# Patient Record
Sex: Male | Born: 1965 | Race: White | Hispanic: No | Marital: Single | Smoking: Current some day smoker
Health system: Southern US, Community
[De-identification: ages and names within clinical notes are randomized; demographics above are authoritative.]

## PROBLEM LIST (undated history)

## (undated) DIAGNOSIS — J449 Chronic obstructive pulmonary disease, unspecified: Secondary | ICD-10-CM

## (undated) DIAGNOSIS — I10 Essential (primary) hypertension: Secondary | ICD-10-CM

## (undated) HISTORY — PX: WRIST SURGERY: SHX841

---

## 2010-11-17 ENCOUNTER — Emergency Department (HOSPITAL_COMMUNITY): Payer: Self-pay

## 2010-11-17 ENCOUNTER — Observation Stay (HOSPITAL_COMMUNITY)
Admission: EM | Admit: 2010-11-17 | Discharge: 2010-11-18 | Disposition: A | Discharge status: Home / Self Care | Payer: Self-pay | Attending: Orthopedic Surgery | Admitting: Orthopedic Surgery

## 2010-11-17 DIAGNOSIS — Y998 Other external cause status: Secondary | ICD-10-CM | POA: Insufficient documentation

## 2010-11-17 DIAGNOSIS — Z01818 Encounter for other preprocedural examination: Secondary | ICD-10-CM | POA: Insufficient documentation

## 2010-11-17 DIAGNOSIS — W108XXA Fall (on) (from) other stairs and steps, initial encounter: Secondary | ICD-10-CM | POA: Insufficient documentation

## 2010-11-17 DIAGNOSIS — Z01812 Encounter for preprocedural laboratory examination: Secondary | ICD-10-CM | POA: Insufficient documentation

## 2010-11-17 DIAGNOSIS — G5600 Carpal tunnel syndrome, unspecified upper limb: Secondary | ICD-10-CM | POA: Insufficient documentation

## 2010-11-17 DIAGNOSIS — Y92009 Unspecified place in unspecified non-institutional (private) residence as the place of occurrence of the external cause: Secondary | ICD-10-CM | POA: Insufficient documentation

## 2010-11-17 DIAGNOSIS — S52599A Other fractures of lower end of unspecified radius, initial encounter for closed fracture: Principal | ICD-10-CM | POA: Insufficient documentation

## 2010-11-17 DIAGNOSIS — S0100XA Unspecified open wound of scalp, initial encounter: Secondary | ICD-10-CM | POA: Insufficient documentation

## 2010-11-17 LAB — CBC
HCT: 38.9 % — ABNORMAL LOW (ref 39.0–52.0)
MCV: 91.5 fL (ref 78.0–100.0)
Platelets: 440 10*3/uL — ABNORMAL HIGH (ref 150–400)
RBC: 4.25 MIL/uL (ref 4.22–5.81)
WBC: 18.5 10*3/uL — ABNORMAL HIGH (ref 4.0–10.5)

## 2010-11-17 LAB — PROTIME-INR: Prothrombin Time: 12.2 seconds (ref 11.6–15.2)

## 2010-11-17 LAB — BASIC METABOLIC PANEL
BUN: 9 mg/dL (ref 6–23)
Chloride: 107 mEq/L (ref 96–112)
GFR calc non Af Amer: >60 mL/min (ref >60)
Potassium: 4.2 mEq/L (ref 3.5–5.1)
Sodium: 135 mEq/L (ref 135–145)

## 2010-11-21 NOTE — Op Note (Signed)
Stephen Wyatt, YEPIZ NO.:  [id number]  MEDICAL RECORD NO.:  [id number]           PATIENT TYPE:  LOCATION:                                 FACILITY:  PHYSICIAN:  Dionne Ano. Lester Crickenberger, M.D.DATE OF BIRTH:  Dec 17, 1965  DATE OF PROCEDURE: DATE OF DISCHARGE:                              OPERATIVE REPORT   PREOPERATIVE DIAGNOSES: 1. Comminuted complex intraarticular distal radius fracture, right     upper extremity. 2. Acute carpal tunnel syndrome of right upper extremity secondary to     swelling and fracture. 3. Complex scalp laceration.  POSTOPERATIVE DIAGNOSES: 1. Comminuted complex intraarticular distal radius fracture, right     upper extremity. 2. Acute carpal tunnel syndrome of right upper extremity secondary to     swelling and fracture. 3. Complex scalp laceration.  PROCEDURES: 1. Open reduction and internal fixation with DVR plate and screw     construct of right distal radius fracture, intraarticular in nature     with greater than 5 fragments. 2. Right open carpal tunnel release. 3. Fasciotomy about the superficial and deep compartments, right upper     extremity. 4. Irrigation and debridement of skin, subcutaneous tissue, and bone,     large complex scalp laceration with incomplete repair in the     emergency room. 5. Complex repair of scalp laceration, greater than 10 cm.  SURGEON:  Dionne Ano. Amanda Pea, MD  ASSISTANT:  None.  ANESTHESIA:  General.  COMPLICATIONS:  None immediate.  TOURNIQUET TIME:  Less than an hour.  One drain was placed in the arm.  INDICATIONS FOR PROCEDURE:  A 45 year old male presents with above- mentioned diagnoses.  I have counseled him in regards to risks and benefits of surgery including risks of infection, bleeding, anesthesia, damage to normal structures, and failure of surgery to accomplish its intended goals of relieving symptoms and restoring function.  With this in mind, he desires to proceed.  All  questions have been encouraged and answered preoperatively.  OPERATIVE PROCEDURE:  The patient was seen by myself and Anesthesia.  A time-out was called.  Preop consent was verified and discussed with the patient.  Arm had been marked.  All preop checklist was completed and he was taken to operative room and underwent a general anesthetic under the direction of Dr. Gypsy Balsam and colleagues.  Once this was done, he was prepped and draped in the usual sterile fashion about the right upper extremity with Betadine scrub and paint, 10 minutes in nature. Preoperative antibiotics were given in the form of Ancef.  Following this, the patient then underwent careful padding of the body parts and a sterile field was secured.  The arm was then elevated.  Tourniquet was insufflated to 250 mmHg and an incision was made volar radial.  FCR tendon was released palmarly and dorsally.  Superficial and deep antebrachial fascia was released without complications and this was done very meticulously given the fact that he had tense swelling and acute carpal tunnel symptoms preoperatively.  Once fasciotomy was performed, I dissected down and noted that the musculature looked well, incised the pronator, and then reassembled the fracture.  The fracture fragments  were reassembled nicely and provisional fixation held with finger trap traction and pressure.  Following this, with combination of orthopedic instruments, I had provisional fixation and then applied a DVR plate and screw construct in a standard technique.  The plate and screws provided excellent restoration of radial height, inclination, and volar tilt.  I placed him under live fluoro and all looked quite well.  Permanent copies were taken.  Following this, attention was turned towards closing the pronator.  The pronator was closed with Vicryl.  I then turned attention towards the carpal tunnel.  Carpal tunnel underwent a 1-inch incision approximately and  dissection was carried down to the palmar fascia.  This was incised and following this the complete transverse carpal ligament was released off the ulnar ledge with some blood noted in the canal.  I released portions of the antebrachial fascia proximally and the patient tolerated this well. Following this, I deflated the tourniquet washing with greater than 3 liters of fluid and closed the superficial subcu with Vicryl over a TLSO drain and the skin edge with Prolene about the main volar radial incision.  The second incision for the carpal tunnel release was closed with Prolene.  Hemostasis was very satisfactory.  Following this, Xeroform, Adaptic gauze, Kerlix, Webril, and a volar plaster splint was applied.  He tolerated this well.  Drain was hooked up to suction and 20 mL of Sensorcaine without epinephrine was placed in the wound for postop analgesia.  Following this, I turned attention towards the scalp.  I discussed with him that we would look at this and dress it.  In reviewing this, it showed a lot of matted hair and an area that had not been repaired that was quite significant.  It was very apparent that leaving the gaping wound open would be a very poor decision given the deep laceration which extended to the periosteal tissue.  Thus I placed staple clips and then performed a formal I and D of skin, subcutaneous tissue, and bone.  This was done with copious amounts of saline.  I obtained hemostasis with bipolar electrocautery, trimmed the wound edges, and then excised a small strip of nonviable skin tissue and then repaired the scalp laceration with a complex closure.  The patient tolerated this quite well.  I felt much better about the condition of his scalp.  There was no obvious skull fracture and this was correlated with the CT scan of course.  Nevertheless, this was a large greater than 10-cm laceration which did require distinct care.  The I and D went without difficulty,  I should note.  Following this, he was awoken from anesthesia and taken to the recovery room.  He will be monitored closely in the recovery room.  We will plan for daily dressing changes to the scalp and elevation, antibiotics, ice, and range of motion to the fingertips in regards to the right upper extremity.  I have discussed all issues at length and all questions have been encouraged and answered.     Dionne Ano. Amanda Pea, M.D.     [facility]  D:  11/17/2010  T:  11/18/2010  Job:  [id number]  Electronically Signed by Dominica Severin M.D. on 11/21/2010 07:05:38 PM

## 2012-06-02 IMAGING — CT CT HEAD W/O CM
4 of 6 series · 17 of 40 positions shown, 18 images · non-contrast
Comparison: None.

CT HEAD

CLINICAL DATA: Recent fall, pain

CT HEAD WITHOUT CONTRAST
CT CERVICAL SPINE WITHOUT CONTRAST
TECHNIQUE: Multidetector CT imaging of the head and cervical spine
was performed following the standard protocol without intravenous
contrast.  Multiplanar CT image reconstructions of the cervical
spine were also generated.

[Series 2: brain · axial · 0.48mm/px · z∈[-130,-46]mm · 3 of 32 slices shown]
[im 8/32  brain]
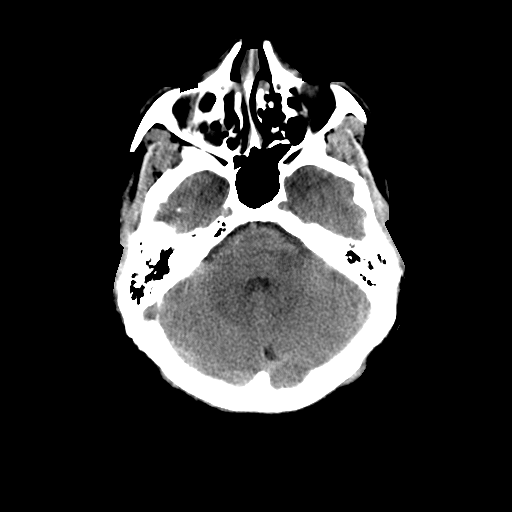
[im 16/32  brain]
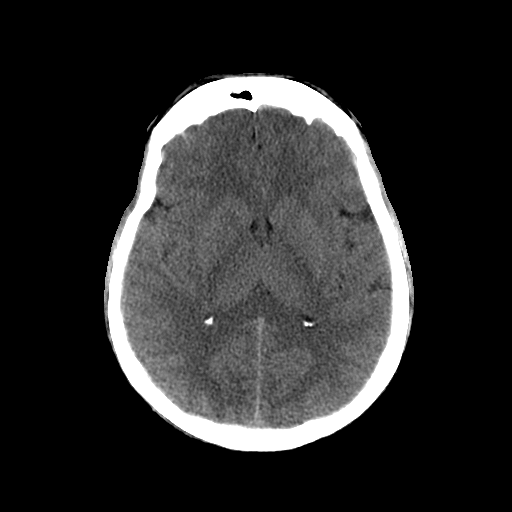
[im 24/32  brain]
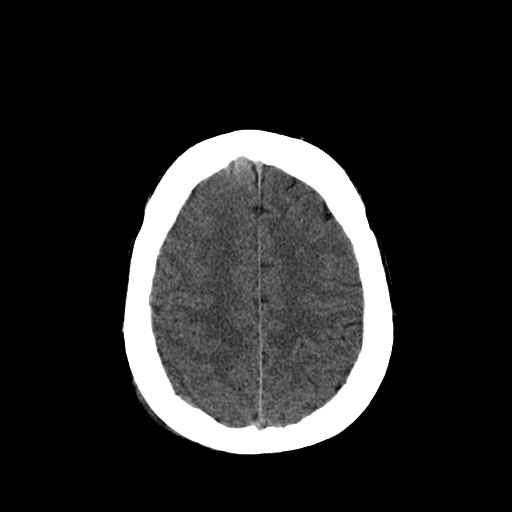

[Series 3: recon 2: brain · axial · 0.48mm/px · z∈[-150,-21]mm · 8 of 64 slices shown]
[im 8/64  brain]
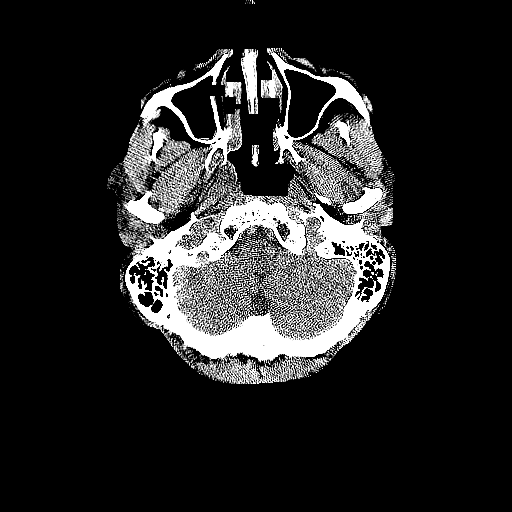
[im 15/64  brain]
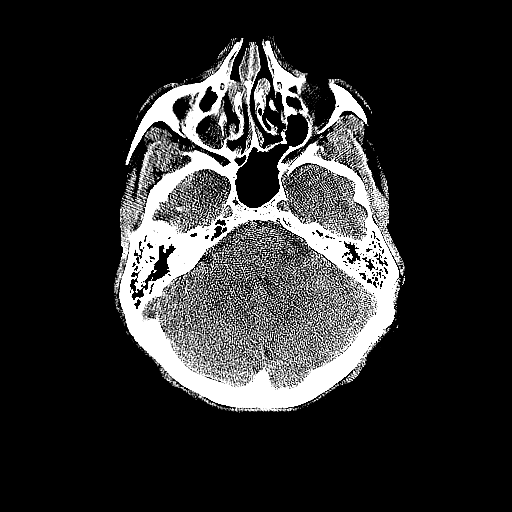
[im 22/64  brain]
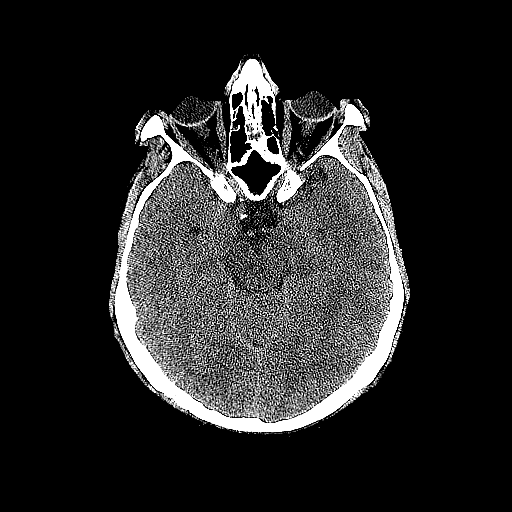
[im 29/64  brain]
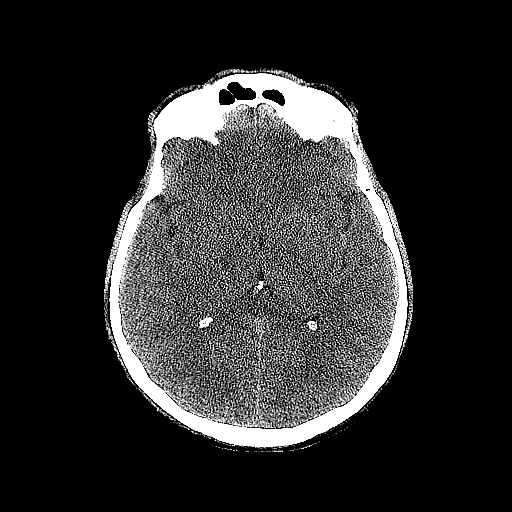
[im 36/64  brain]
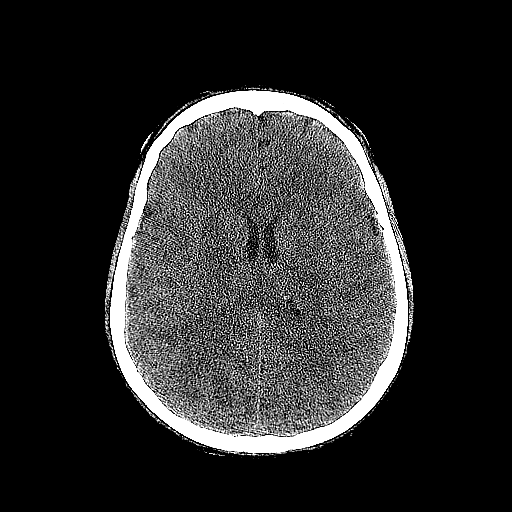
[im 43/64  brain]
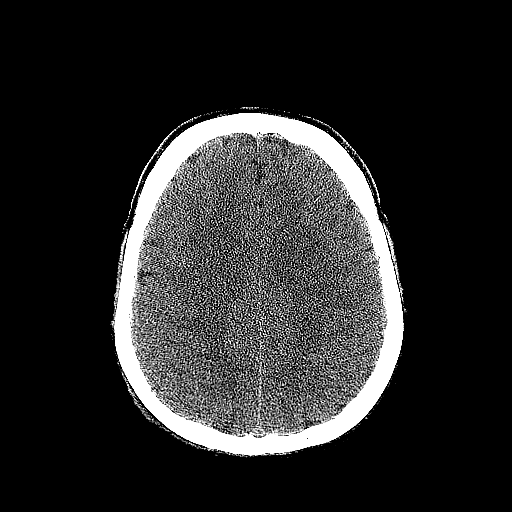
[im 50/64  brain]
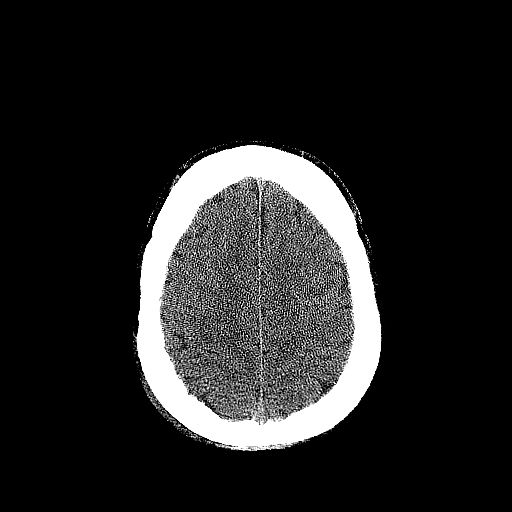
[im 57/64  brain]
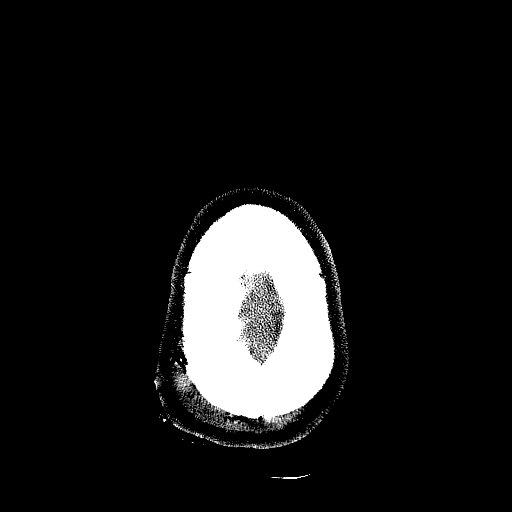

[Series 601: cor · coronal · 0.38mm/px · 3 of 29 slices shown]
[im 10/29  brain]
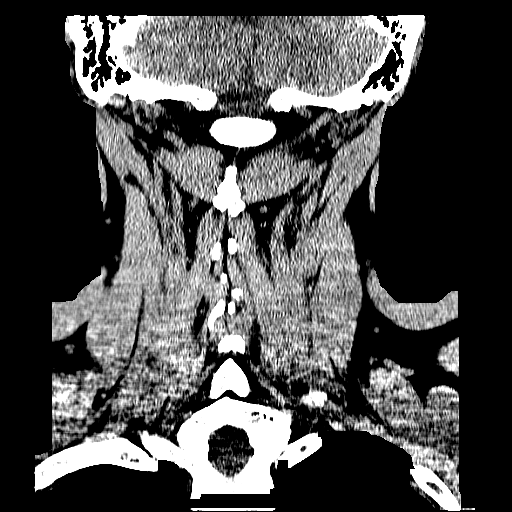
[im 13/29  brain]
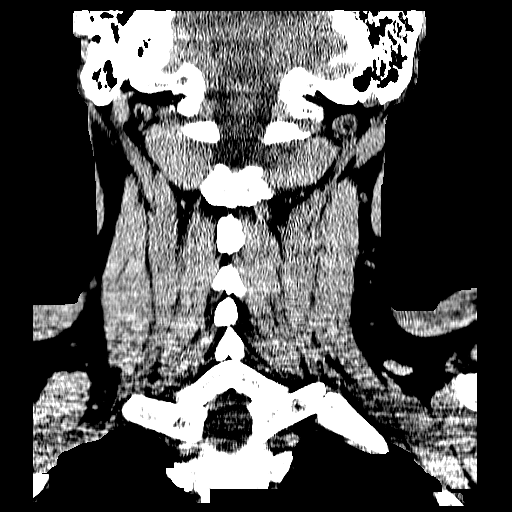
[im 16/29  brain]
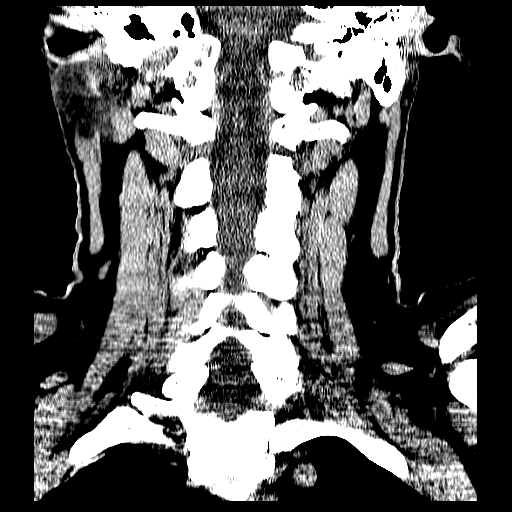

[Series 603: axial · axial · 0.38mm/px · z∈[-372,-330]mm · 3 of 31 slices shown, 4 images]
[im 8/31  brain]
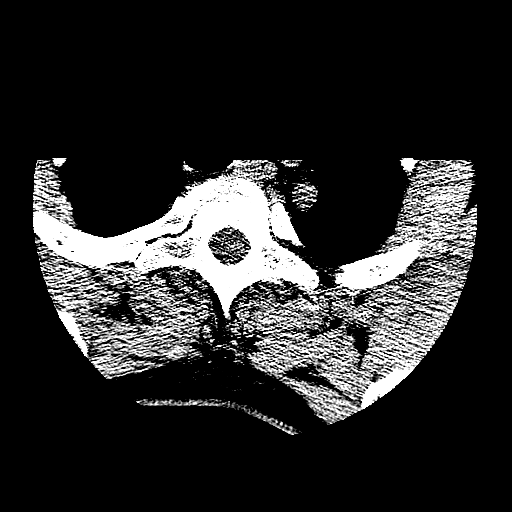
[im 8/31  bone]
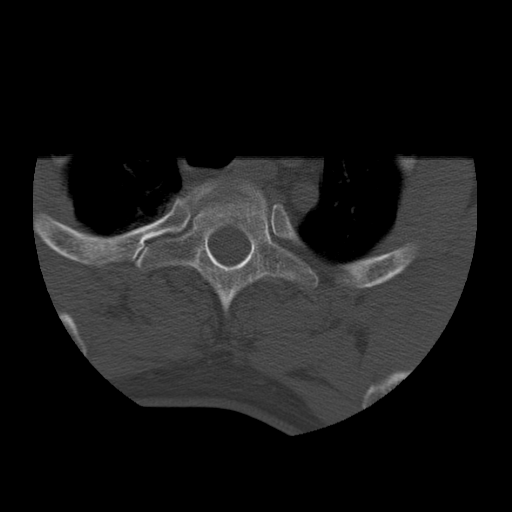
[im 16/31  brain]
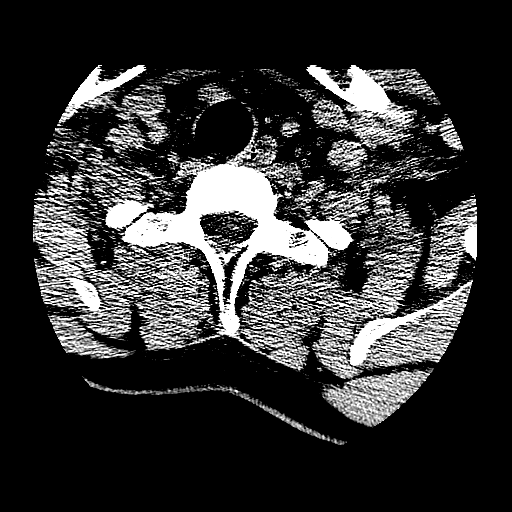
[im 23/31  brain]
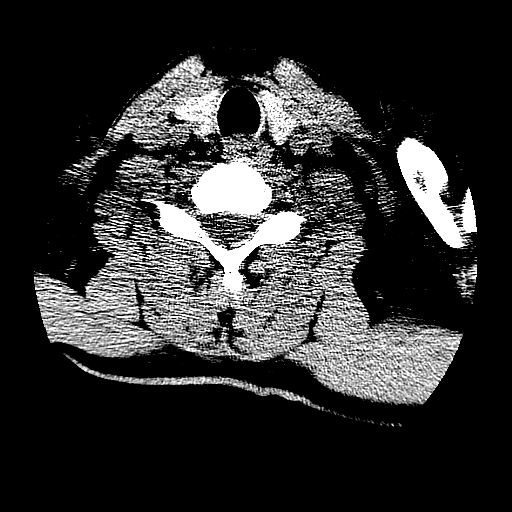

[17 of 40 positions shown; findings below may reference images not displayed]

FINDINGS: The ventricular system is normal in size and
configuration, and the septum is in a normal midline position.  The
fourth ventricle and basilar cisterns appear normal.  No
hemorrhage, mass lesion, or acute infarction is seen.  On bone
window images, no calvarial abnormality is seen.  There does appear
to be a high right post parietal scalp hematoma present.  Some
opacification of the right maxillary sinus is seen.
IMPRESSION: 1.  No acute intracranial abnormality.
2.  Probable high right appears to parietal scalp hematoma.
3.  Mucosal thickening in the right maxillary sinus.

CT CERVICAL SPINE
FINDINGS: The cervical vertebrae are in normal alignment.  Minimal
degenerative disc disease is present at C4-5 and C5-6 levels.  The
odontoid process is intact.  No prevertebral soft tissue swelling
is seen.  No cervical spine fracture is noted.  No soft tissue
abnormality is seen.  Probable biapical pleuroparenchymal scarring
is noted.
IMPRESSION: Normal alignment with only mild degenerative change.  No acute
cervical spine fracture.

## 2013-04-13 ENCOUNTER — Ambulatory Visit: Payer: Self-pay | Admitting: Orthopedic Surgery

## 2013-04-21 ENCOUNTER — Ambulatory Visit (INDEPENDENT_AMBULATORY_CARE_PROVIDER_SITE_OTHER): Payer: BC Managed Care – PPO | Admitting: Orthopedic Surgery

## 2013-04-21 ENCOUNTER — Ambulatory Visit (INDEPENDENT_AMBULATORY_CARE_PROVIDER_SITE_OTHER): Payer: BC Managed Care – PPO

## 2013-04-21 VITALS — BP 138/93 | Ht 67.5 in | Wt 171.0 lb

## 2013-04-21 DIAGNOSIS — M25561 Pain in right knee: Secondary | ICD-10-CM

## 2013-04-21 DIAGNOSIS — M1711 Unilateral primary osteoarthritis, right knee: Secondary | ICD-10-CM | POA: Insufficient documentation

## 2013-04-21 DIAGNOSIS — M25569 Pain in unspecified knee: Secondary | ICD-10-CM

## 2013-04-21 DIAGNOSIS — M179 Osteoarthritis of knee, unspecified: Secondary | ICD-10-CM

## 2013-04-21 DIAGNOSIS — M1710 Unilateral primary osteoarthritis, unspecified knee: Secondary | ICD-10-CM

## 2013-04-21 MED ORDER — HYDROCODONE-ACETAMINOPHEN 5-325 MG PO TABS: 1.0000 | ORAL_TABLET | Freq: Four times a day (QID) | ORAL | Status: DC | PRN

## 2013-04-21 MED ORDER — DICLOFENAC POTASSIUM 50 MG PO TABS: 50.0000 mg | ORAL_TABLET | Freq: Two times a day (BID) | ORAL | Status: DC

## 2013-04-21 NOTE — Patient Instructions (Addendum)
You have received a steroid shot. 15% of patients experience increased pain at the injection site with in the next 24 hours. This is best treated with ice and tylenol extra strength 2 tabs every 8 hours. If you are still having pain please call the office.    Wear and Tear Disorders of the Knee (Arthritis, Osteoarthritis) Everyone will experience wear and tear injuries (arthritis, osteoarthritis) of the knee. These are the changes we all get as we age. They come from the joint stress of daily living. The amount of cartilage damage in your knee and your symptoms determine if you need surgery. Mild problems require approximately two months recovery time. More severe problems take several months to recover. With mild problems, your surgeon may find worn and rough cartilage surfaces. With severe changes, your surgeon may find cartilage that has completely worn away and exposed the bone. Loose bodies of bone and cartilage, bone spurs (excess bone growth), and injuries to the menisci (cushions between the large bones of your leg) are also common. All of these problems can cause pain. For a mild wear and tear problem, rough cartilage may simply need to be shaved and smoothed. For more severe problems with areas of exposed bone, your surgeon may use an instrument for roughing up the bone surfaces to stimulate new cartilage growth. Loose bodies are usually removed. Torn menisci may be trimmed or repaired. ABOUT THE ARTHROSCOPIC PROCEDURE Arthroscopy is a surgical technique. It allows your orthopedic surgeon to diagnose and treat your knee injury with accuracy. The surgeon looks into your knee through a small scope. The scope is like a small (pencil-sized) telescope. Arthroscopy is less invasive than open knee surgery. You can expect a more rapid recovery. After the procedure, you will be moved to a recovery area until most of the effects of the medication have worn off. Your caregiver will discuss the test results  with you. RECOVERY The severity of the arthritis and the type of procedure performed will determine recovery time. Other important factors include age, physical condition, medical conditions, and the type of rehabilitation program. Strengthening your muscles after arthroscopy helps guarantee a better recovery. Follow your caregiver's instructions. Use crutches, rest, elevate, ice, and do knee exercises as instructed. Your caregivers will help you and instruct you with exercises and other physical therapy required to regain your mobility, muscle strength, and functioning following surgery. Only take over-the-counter or prescription medicines for pain, discomfort, or fever as directed by your caregiver.  SEEK MEDICAL CARE IF:   There is increased bleeding (more than a small spot) from the wound.  You notice redness, swelling, or increasing pain in the wound.  Pus is coming from wound.  You develop an unexplained oral temperature above 102 F (38.9 C) , or as your caregiver suggests.  You notice a foul smell coming from the wound or dressing.  You have severe pain with motion of the knee. SEEK IMMEDIATE MEDICAL CARE IF:   You develop a rash.  You have difficulty breathing.  You have any allergic problems. MAKE SURE YOU:   Understand these instructions.  Will watch your condition.  Will get help right away if you are not doing well or get worse. Document Released: 07/04/2000 Document Revised: 09/29/2011 Document Reviewed: 12/01/2007 ExitCare Patient Information 2014 [employer]. Total Knee Replacement Total knee replacement is a procedure to replace your knee joint with an artificial knee joint (prosthetic knee joint). The purpose of this surgery is to reduce pain and improve your   knee function. LET YOUR CAREGIVER KNOW ABOUT:   Any allergies you have.  Any medicines you are taking, including vitamins, herbs, eyedrops, over-the-counter medicines, and creams.  Any problems you  have had with the use of anesthetics.  Family history of problems with the use of anesthetics.  Any blood disorders you have, including bleeding problems or clotting problems.  Previous surgeries you have had. RISKS AND COMPLICATIONS  Generally, total knee replacement is a safe procedure. However, as with any surgical procedure, complications can occur. Possible complications associated with total knee replacement include:  Loss of range of motion of the knee or instability.  Loosening of the prosthesis.  Infection.  Persistent pain. BEFORE THE PROCEDURE   Your caregiver will instruct you when you need to stop eating and drinking.  Ask your caregiver if you need to change or stop any regular medicines. PROCEDURE  Just before the procedure you will receive medicine that will make you drowsy (sedative). This will be given through a tube that is inserted into one of your veins (intravenous [IV] tube). Then you will either receive medicine to block pain from the waist down through your legs (spinal block) or medicine to also receive medicine to make you fall asleep (general anesthetic). You may also receive medicine to block feeling in your leg (nerve block) to help ease pain after surgery. An incision will be made in your knee. Your surgeon will take out any damaged cartilage and bone by sawing off the damaged surfaces. Then the surgeon will put a new metal liner over the sawed off portion of your thigh bone (femur) and a plastic liner over the sawed off portion of one of the bones of your lower leg (tibia). This is to restore alignment and function to your knee. A plastic piece is often used to restore the surface of your knee cap. AFTER THE PROCEDURE  You will be taken to the recovery area. You may have drainage tubes to drain excess fluid from your knee. These tubes attach to a device that removes these fluids. Once you are awake, stable, and taking fluids well, you will be taken to your  hospital room. You will receive physical therapy as prescribed by your caregiver. The length of your stay in the hospital after a knee replacement is 2 4 days. Your surgeon may recommend that you spend time (usually an additional 10 14 days) in an extended-care facility to help you begin walking again and improve your range of motion before you go home. You may also be prescribed blood-thinning medicine to decrease your risk of developing blood clots in your leg. Document Released: 10/13/2000 Document Revised: 01/06/2012 Document Reviewed: 08/17/2011 Exit[employer] Patient Information 2014 [employer].  

## 2013-04-25 ENCOUNTER — Encounter: Payer: Self-pay | Admitting: Orthopedic Surgery
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
## 2013-05-08 ENCOUNTER — Emergency Department (HOSPITAL_COMMUNITY): Payer: BC Managed Care – PPO

## 2013-05-08 ENCOUNTER — Encounter (HOSPITAL_COMMUNITY): Payer: Self-pay | Admitting: Emergency Medicine

## 2013-05-08 ENCOUNTER — Emergency Department (HOSPITAL_COMMUNITY)
Admission: EM | Admit: 2013-05-08 | Discharge: 2013-05-08 | Disposition: A | Discharge status: Home / Self Care | Payer: BC Managed Care – PPO | Attending: Emergency Medicine | Admitting: Emergency Medicine

## 2013-05-08 DIAGNOSIS — J45909 Unspecified asthma, uncomplicated: Secondary | ICD-10-CM

## 2013-05-08 DIAGNOSIS — Z791 Long term (current) use of non-steroidal anti-inflammatories (NSAID): Secondary | ICD-10-CM | POA: Insufficient documentation

## 2013-05-08 DIAGNOSIS — F17200 Nicotine dependence, unspecified, uncomplicated: Secondary | ICD-10-CM | POA: Insufficient documentation

## 2013-05-08 DIAGNOSIS — J45901 Unspecified asthma with (acute) exacerbation: Secondary | ICD-10-CM | POA: Insufficient documentation

## 2013-05-08 MED ORDER — PREDNISONE 50 MG PO TABS
60.0000 mg | ORAL_TABLET | Freq: Once | ORAL | Status: AC
  Administered 2013-05-08: 60 mg via ORAL
  Filled 2013-05-08 (×2): qty 1

## 2013-05-08 MED ORDER — IPRATROPIUM BROMIDE 0.02 % IN SOLN
0.5000 mg | Freq: Once | RESPIRATORY_TRACT | Status: AC
  Administered 2013-05-08: 0.5 mg via RESPIRATORY_TRACT
  Filled 2013-05-08: qty 2.5

## 2013-05-08 MED ORDER — ALBUTEROL SULFATE HFA 108 (90 BASE) MCG/ACT IN AERS
2.0000 | INHALATION_SPRAY | Freq: Once | RESPIRATORY_TRACT | Status: AC
  Administered 2013-05-08: 2 via RESPIRATORY_TRACT
  Filled 2013-05-08: qty 6.7

## 2013-05-08 MED ORDER — ALBUTEROL SULFATE (5 MG/ML) 0.5% IN NEBU
5.0000 mg | INHALATION_SOLUTION | Freq: Once | RESPIRATORY_TRACT | Status: AC
  Administered 2013-05-08: 5 mg via RESPIRATORY_TRACT
  Filled 2013-05-08: qty 1

## 2013-05-08 MED ORDER — AZITHROMYCIN 250 MG PO TABS: ORAL_TABLET | ORAL | Status: DC

## 2013-05-08 MED ORDER — PREDNISONE 20 MG PO TABS: ORAL_TABLET | ORAL | Status: DC

## 2013-05-08 NOTE — ED Notes (Signed)
Patient resting in no apparent distress and currently awaiting MD orders.

## 2013-05-08 NOTE — ED Notes (Signed)
Respiratory at bedside.
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
## 2013-05-08 NOTE — ED Notes (Signed)
MD at bedside. 
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
## 2013-05-08 NOTE — ED Notes (Signed)
Patient reports: -feeling "a lot better" since the breathing TX

## 2013-05-26 ENCOUNTER — Ambulatory Visit (INDEPENDENT_AMBULATORY_CARE_PROVIDER_SITE_OTHER): Payer: BC Managed Care – PPO | Admitting: Orthopedic Surgery

## 2013-05-26 VITALS — BP 138/92 | Ht 67.5 in | Wt 171.0 lb

## 2013-05-26 DIAGNOSIS — M25569 Pain in unspecified knee: Secondary | ICD-10-CM

## 2013-05-26 DIAGNOSIS — M179 Osteoarthritis of knee, unspecified: Secondary | ICD-10-CM

## 2013-05-26 DIAGNOSIS — M1710 Unilateral primary osteoarthritis, unspecified knee: Secondary | ICD-10-CM

## 2013-05-26 DIAGNOSIS — M25561 Pain in right knee: Secondary | ICD-10-CM

## 2013-05-26 MED ORDER — HYDROCODONE-ACETAMINOPHEN 5-325 MG PO TABS: 1.0000 | ORAL_TABLET | Freq: Four times a day (QID) | ORAL | Status: DC | PRN

## 2013-05-26 MED ORDER — NABUMETONE 500 MG PO TABS: 500.0000 mg | ORAL_TABLET | Freq: Two times a day (BID) | ORAL | Status: DC

## 2013-05-26 NOTE — Patient Instructions (Signed)
Refer to Dr. Charlann Boxer or Alusio for partial knee replacement evaluation

## 2013-05-26 NOTE — Progress Notes (Signed)
Patient ID: Stephen Wyatt, male   DOB: Apr 25, 1966, 47 y.o.   MRN: [id number]  Chief Complaint  Patient presents with  . Follow-up    one month recheck right knee s/p injection    Status post injection right knee patient has also been on anti-inflammatories for right knee pain. He has a grinding sensation in the lateral joint line and lateral compartment arthritis on x-ray  The patient is not or has not improved. He also has other joint complaints which we'll address at another setting  His options at this point include cartilage supplementation, medication for pain, surgical debridement arthroscopically, hemiarthroplasty, total knee replacement. We discussed the risks and benefits of each. He would like to proceed with surgery. I think at his age of 44 despite the mild degenerative changes we see on the x-ray in the patellofemoral joint, we should have him evaluated for a lateral partial knee replacement. If he is not a candidate for that and we have decided to proceed with a total knee replacement  Meds ordered this encounter  Medications  . HYDROcodone-acetaminophen (NORCO) 5-325 MG per tablet    Sig: Take 1 tablet by mouth every 6 (six) hours as needed.    Dispense:  180 tablet    Refill:  0  . nabumetone (RELAFEN) 500 MG tablet    Sig: Take 1 tablet (500 mg total) by mouth 2 (two) times daily.    Dispense:  60 tablet    Refill:  5

## 2013-05-27 ENCOUNTER — Other Ambulatory Visit: Payer: Self-pay | Admitting: *Deleted

## 2013-05-27 ENCOUNTER — Telehealth: Payer: Self-pay | Admitting: *Deleted

## 2013-05-27 DIAGNOSIS — M1711 Unilateral primary osteoarthritis, right knee: Secondary | ICD-10-CM

## 2013-05-27 NOTE — Telephone Encounter (Signed)
Office notes and referral faxed to [facility] orthopedics. Awaiting appointment.

## 2013-06-15 ENCOUNTER — Ambulatory Visit: Payer: BC Managed Care – PPO | Admitting: Orthopedic Surgery

## 2013-06-20 ENCOUNTER — Telehealth: Payer: Self-pay | Admitting: Orthopedic Surgery

## 2013-06-20 ENCOUNTER — Ambulatory Visit: Payer: BC Managed Care – PPO | Admitting: Orthopedic Surgery

## 2013-06-20 NOTE — Telephone Encounter (Signed)
Advised patient to call back on Thursday 06/23/13 for refill. Also,  I ask about the referral to [facility],and the patient states that he has a previous balance with them that has to be paid before they will see him.

## 2013-06-20 NOTE — Telephone Encounter (Signed)
Patient called, requests medication refill for right knee problem, states it is due on 06/25/13 (weekend) - medication: Hydrocodone/Norco 5/325.  Patient also re-scheduled his appointment from 06/15/13, for new problem, left knee, for 07/05/13.  ** Please also see recent note (05/26/13) indicating that patient has been referred to [facility] for the right knee - no appointment yet?   Patient's ph# is [phone].

## 2013-06-23 NOTE — Telephone Encounter (Signed)
Patient called back today, 06/23/13, as advised, regarding prescription refill/pain medication as noted.    Also, regarding referral, states is working with [facility], due to account; appointment pending.

## 2013-07-05 ENCOUNTER — Encounter: Payer: Self-pay | Admitting: Orthopedic Surgery

## 2013-07-05 ENCOUNTER — Ambulatory Visit: Payer: BC Managed Care – PPO | Admitting: Orthopedic Surgery

## 2013-07-25 ENCOUNTER — Telehealth: Payer: Self-pay | Admitting: *Deleted

## 2013-07-25 NOTE — Telephone Encounter (Signed)
Patient called requesting refill for Hydrocodone 5-325 mg. Please advise

## 2013-07-26 ENCOUNTER — Other Ambulatory Visit: Payer: Self-pay | Admitting: *Deleted

## 2013-07-26 DIAGNOSIS — M1710 Unilateral primary osteoarthritis, unspecified knee: Secondary | ICD-10-CM

## 2013-07-26 DIAGNOSIS — M25561 Pain in right knee: Secondary | ICD-10-CM

## 2013-07-26 DIAGNOSIS — M179 Osteoarthritis of knee, unspecified: Secondary | ICD-10-CM

## 2013-07-26 MED ORDER — HYDROCODONE-ACETAMINOPHEN 5-325 MG PO TABS: 1.0000 | ORAL_TABLET | Freq: Four times a day (QID) | ORAL | Status: DC | PRN

## 2013-07-26 NOTE — Telephone Encounter (Signed)
Inform him this is the last hydrocodone next time will change

## 2013-07-26 NOTE — Telephone Encounter (Signed)
Refilled per Dr. Ilsa IhaHarrison,and advised patient that this would be the last prescription for Hydrocodone.

## 2013-09-22 ENCOUNTER — Ambulatory Visit (INDEPENDENT_AMBULATORY_CARE_PROVIDER_SITE_OTHER): Payer: BC Managed Care – PPO | Admitting: Otolaryngology

## 2014-09-05 ENCOUNTER — Encounter: Payer: Self-pay | Admitting: Physical Medicine & Rehabilitation

## 2014-10-10 ENCOUNTER — Encounter: Payer: Self-pay | Admitting: Physical Medicine & Rehabilitation

## 2014-10-10 ENCOUNTER — Encounter
Payer: BLUE CROSS/BLUE SHIELD | Attending: Physical Medicine & Rehabilitation | Admitting: Physical Medicine & Rehabilitation

## 2014-10-10 ENCOUNTER — Other Ambulatory Visit: Payer: Self-pay | Admitting: Physical Medicine & Rehabilitation

## 2014-10-10 VITALS — BP 141/85 | HR 94 | Resp 14

## 2014-10-10 DIAGNOSIS — M1712 Unilateral primary osteoarthritis, left knee: Secondary | ICD-10-CM | POA: Diagnosis not present

## 2014-10-10 DIAGNOSIS — M25561 Pain in right knee: Secondary | ICD-10-CM

## 2014-10-10 DIAGNOSIS — G894 Chronic pain syndrome: Secondary | ICD-10-CM

## 2014-10-10 DIAGNOSIS — M1711 Unilateral primary osteoarthritis, right knee: Secondary | ICD-10-CM | POA: Diagnosis not present

## 2014-10-10 DIAGNOSIS — Z5181 Encounter for therapeutic drug level monitoring: Secondary | ICD-10-CM | POA: Diagnosis not present

## 2014-10-10 MED ORDER — MELOXICAM 15 MG PO TABS: 15.0000 mg | ORAL_TABLET | Freq: Every day | ORAL | Status: AC

## 2014-10-10 NOTE — Patient Instructions (Addendum)
ONCE I HAVE CONFIRMATION THAT YOUR URINE SPECIMEN IS CONSISTENT WITH YOUR HISTORY AND PRESCRIBED MEDICATIONS, I WILL BE WILLING TO PRESCRIBE YOUR PAIN MEDICATION. THE RESULTS OF YOUR URINE TESTING COULD TAKE A WEEK OR MORE TO RETURN, HOWEVER.  IF WE DO NOT CONTACT YOU REGARDING THESE RESULTS WITHIN 10 DAYS, PLEASE CONTACT US.      PLEASE CALL ME WITH ANY PROBLEMS OR QUESTIONS [phone]).

## 2014-10-10 NOTE — Progress Notes (Signed)
Subjective:    Patient ID: Stephen Wyatt, male    DOB: [birth year], 50 y.o.   MRN: [id number]  HPI   This is an initial visit for Stephen Wyatt who presents today for an evaluation of his chronic right knee pain. He's had pain in the knees since a work related accident at the age of 7. For pain control he has been using alleve and advil most recently. He usually takes  of advil per day or  per day of alleve. He's steroid injections in the past but no viscosupplements.  His pcp stopped writing his narcotics in December--he had been taking hydrocodone 5/325 3x daily prior to this. (had been taking for about 2.5 to 3 years).  He sometimes will use ben gay ointment when he gets home for work. He also utilizes heat or ice. Heat helps the most.  He does have a brace which he wears fairly regularly---more often when his leg pain is more severe.      I found an xray of his right knee from 04/2013 which revealed: The knee is in valgus alignment. The lateral compartment has lost its normal height. Osteophytes are noted in the distal margin of the lateral femoral condyle. 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 He typically works 50-60 hours per week. He's on his feet the majority of the day.     Pain Inventory Average Pain 7 Pain Right Now 5 My pain is stabbing and aching  In the last 24 hours, has pain interfered with the following? General activity 7 Relation with others 3 Enjoyment of life 6 What TIME of day is your pain at its worst? daytime and evening Sleep (in general) Fair  Pain is worse with: walking, bending, standing and some activites Pain improves with: rest and medication Relief from Meds: 4  Mobility walk without assistance how many minutes can you walk? 30 ability to climb steps?  no do you drive?  yes  Function employed # of hrs/week 50 Do you have any goals in this area?  yes  Neuro/Psych No problems in this  area  Prior Studies x-rays  Physicians involved in your care Primary care [place] Man PA/ [place] Found MD Orthopedist ? Romeo Apple   Family History  Problem Relation Age of Onset  . Heart disease    . Arthritis    . Cancer     History   Social History  . Marital Status: Single    Spouse Name: N/A  . Number of Children: N/A  . Years of Education: N/A   Social History Main Topics  . Smoking status: Current Some Day Smoker -- 1.00 packs/day    Types: Cigarettes  . Smokeless tobacco: Not on file  . Alcohol Use: No  . Drug Use: No  . Sexual Activity: Not on file   Other Topics Concern  . None   Social History Narrative   Past Surgical History  Procedure Laterality Date  . Wrist surgery     History reviewed. No pertinent past medical history. BP 141/85 mmHg  Pulse 94  Resp 14  SpO2 98%  Opioid Risk Score: 0 Fall Risk Score: Low Fall Risk (0-5 points) (educated and given handout today)`1  Depression screen PHQ 2/9  Depression screen PHQ 2/9 10/10/2014  Decreased Interest 0  Down, Depressed, Hopeless 0  PHQ - 2 Score 0  Altered sleeping 0  Tired, decreased energy 0  Change in appetite 0  Feeling  bad or failure about yourself  0  Trouble concentrating 0  Moving slowly or fidgety/restless 0  Suicidal thoughts 0  PHQ-9 Score 0     Review of Systems  Musculoskeletal: Positive for arthralgias.  All other systems reviewed and are negative.      Objective:   Physical Exam   General: Alert and oriented x 3, No apparent distress HEENT: Head is normocephalic, atraumatic, PERRLA, EOMI, sclera anicteric, oral mucosa pink and moist, dentition intact, ext ear canals clear,  Neck: Supple without JVD or lymphadenopathy Heart: Reg rate and rhythm. No murmurs rubs or gallops Chest: CTA bilaterally without wheezes, rales, or rhonchi; no distress Abdomen: Soft, non-tender, non-distended, bowel sounds positive. Extremities: No clubbing, cyanosis, or edema. Pulses  are 2+ Skin: Clean and intact without signs of breakdown Neuro: Pt is cognitively appropriate with normal insight, memory, and awareness. Cranial nerves 2-12 are intact. Sensory exam is normal. Reflexes are 2+ in all 4's. Fine motor coordination is intact. No tremors. Motor function is grossly 5/5.  Musculoskeletal: severe valgus deformity at right knee, worsened with weight bearing. Joint line tenderness medially and laterally on right. Crepitus with PROM and AROM. No knee instability. mcmurray's + bilaterally. No effusion. left knee with crepitus during ex/flexion with mild patellar pain noted in palpation. Otherwise left knee unremarkable.  Psych: Pt's affect is appropriate. Pt is cooperative       Assessment & Plan:  1. Endstage OA of right knee, lateral compartment most involved. He has been deemed too young for a knee replacement 2. 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 Will schedule synvisc injection right knee at next visit 2. OA right knee brace---referral made to [facility]---to off load right knee 3. Mobic 15mg  daily with food--stop ibuprofen and naproxen 4. UDS was collected. I will consider writing narcotics if his UDS is consistent, was taking hydrocodone 5 8prn up until December last year. 5. Follow up here in about a month. Thirty minutes of face to face patient care time were spent during this visit. All questions were encouraged and answered.

## 2014-10-11 LAB — PRESCRIPTION MONITORING PROFILE (SOLSTAS)
AMPHETAMINE/METH: NEGATIVE ng/mL
BARBITURATE SCREEN, URINE: NEGATIVE ng/mL
BUPRENORPHINE, URINE: NEGATIVE ng/mL
Benzodiazepine Screen, Urine: NEGATIVE ng/mL
CANNABINOID SCRN UR: NEGATIVE ng/mL
COCAINE METABOLITES: NEGATIVE ng/mL
CREATININE, URINE: 84.28 mg/dL (ref >20.0)
Carisoprodol, Urine: NEGATIVE ng/mL
FENTANYL URINE: NEGATIVE ng/mL
MDMA URINE: NEGATIVE ng/mL
METHADONE SCREEN, URINE: NEGATIVE ng/mL
Meperidine, Ur: NEGATIVE ng/mL
Nitrites, Initial: NEGATIVE ug/mL
OPIATE SCREEN, URINE: NEGATIVE ng/mL
OXYCODONE SCRN UR: NEGATIVE ng/mL
Propoxyphene: NEGATIVE ng/mL
TAPENTADOLUR: NEGATIVE ng/mL
Tramadol Scrn, Ur: NEGATIVE ng/mL
Zolpidem, Urine: NEGATIVE ng/mL
pH, Initial: 5.2 pH (ref 4.5–8.9)

## 2014-10-11 LAB — PMP ALCOHOL METABOLITE (ETG): ETGU: NEGATIVE ng/mL

## 2014-10-26 NOTE — Progress Notes (Signed)
Urine drug screen for this encounter is consistent for having no prescribed medication 

## 2019-10-03 ENCOUNTER — Emergency Department (HOSPITAL_COMMUNITY)
Admission: EM | Admit: 2019-10-03 | Discharge: 2019-10-03 | Disposition: A | Discharge status: Home / Self Care | Payer: Medicaid Other | Attending: Emergency Medicine | Admitting: Emergency Medicine

## 2019-10-03 ENCOUNTER — Emergency Department (HOSPITAL_COMMUNITY): Payer: Medicaid Other

## 2019-10-03 ENCOUNTER — Other Ambulatory Visit: Payer: Self-pay

## 2019-10-03 ENCOUNTER — Encounter (HOSPITAL_COMMUNITY): Payer: Self-pay

## 2019-10-03 DIAGNOSIS — W109XXA Fall (on) (from) unspecified stairs and steps, initial encounter: Secondary | ICD-10-CM | POA: Insufficient documentation

## 2019-10-03 DIAGNOSIS — S8002XA Contusion of left knee, initial encounter: Secondary | ICD-10-CM | POA: Insufficient documentation

## 2019-10-03 DIAGNOSIS — S8000XA Contusion of unspecified knee, initial encounter: Secondary | ICD-10-CM

## 2019-10-03 DIAGNOSIS — Y9301 Activity, walking, marching and hiking: Secondary | ICD-10-CM | POA: Insufficient documentation

## 2019-10-03 DIAGNOSIS — J449 Chronic obstructive pulmonary disease, unspecified: Secondary | ICD-10-CM | POA: Insufficient documentation

## 2019-10-03 DIAGNOSIS — Y999 Unspecified external cause status: Secondary | ICD-10-CM | POA: Insufficient documentation

## 2019-10-03 DIAGNOSIS — F17210 Nicotine dependence, cigarettes, uncomplicated: Secondary | ICD-10-CM | POA: Insufficient documentation

## 2019-10-03 DIAGNOSIS — Z79899 Other long term (current) drug therapy: Secondary | ICD-10-CM | POA: Insufficient documentation

## 2019-10-03 DIAGNOSIS — Y92009 Unspecified place in unspecified non-institutional (private) residence as the place of occurrence of the external cause: Secondary | ICD-10-CM | POA: Insufficient documentation

## 2019-10-03 DIAGNOSIS — I10 Essential (primary) hypertension: Secondary | ICD-10-CM | POA: Insufficient documentation

## 2019-10-03 HISTORY — DX: Essential (primary) hypertension: I10

## 2019-10-03 HISTORY — DX: Chronic obstructive pulmonary disease, unspecified: J44.9

## 2019-10-03 MED ORDER — NAPROXEN 250 MG PO TABS
500.0000 mg | ORAL_TABLET | Freq: Once | ORAL | Status: AC
Start: 2019-10-03 — End: 2019-10-03
  Administered 2019-10-03: 13:00:00 500 mg via ORAL
  Filled 2019-10-03: qty 2

## 2019-10-03 MED ORDER — NAPROXEN 500 MG PO TABS: 500.0000 mg | ORAL_TABLET | Freq: Two times a day (BID) | ORAL | 0 refills | Status: AC

## 2019-10-03 NOTE — ED Notes (Signed)
Dr Miller in triage to see pt.  

## 2019-10-03 NOTE — ED Notes (Signed)
No signature pad available in triage for pt to sign.

## 2019-10-03 NOTE — Discharge Instructions (Signed)
Naproxen please take this twice daily as needed for pain  Your x-ray showed no signs of broken bones  Use the crutches as needed for comfort  Talk with your doctor about orthopedic intervention for your chronic arthritis and potentially needing a knee replacement.  I have given you the phone number for the on-call orthopedist.

## 2019-10-03 NOTE — ED Triage Notes (Signed)
Pt presents to ED with complaints of left knee pain after hitting on the step last night.

## 2019-10-03 NOTE — ED Provider Notes (Signed)
[facility] EMERGENCY DEPARTMENT Provider Note   CSN: [id number] Arrival date & time: 10/03/19  1156     History Chief Complaint  Patient presents with  . Knee Pain    Stephen Wyatt is a 54 y.o. male.  HPI   54 year old male presents after having a fall on his knees last night when he came home from work.  He slipped on a step and struck bilateral patellar tendons on that step.  He has been walking but with some pain since that time.  Denies other injuries, symptoms are persistent and worse with movement of the knee.  No associated open wounds or bleeding  Past Medical History:  Diagnosis Date  . COPD (chronic obstructive pulmonary disease) ([place])   . Hypertension     Patient Active Problem List   Diagnosis Date Noted  . Osteoarthritis of left knee 10/10/2014  . Osteoarthritis of right knee 04/21/2013  . Right knee pain 04/21/2013    Past Surgical History:  Procedure Laterality Date  . WRIST SURGERY         Family History  Problem Relation Age of Onset  . Heart disease Other   . Arthritis Other   . Cancer Other     Social History   Tobacco Use  . Smoking status: Current Some Day Smoker    Packs/day: 1.00    Types: Cigarettes  . Smokeless tobacco: Never Used  Substance Use Topics  . Alcohol use: No  . Drug use: No    Home Medications Prior to Admission medications   Medication Sig Start Date End Date Taking? Authorizing Provider  lisinopril (PRINIVIL,ZESTRIL) 10 MG tablet Take 10 mg by mouth daily.    [provider]  meloxicam (MOBIC) 15 MG tablet Take 1 tablet (15 mg total) by mouth daily. 10/10/14   Meredith Staggers, MD  naproxen (NAPROSYN) 500 MG tablet Take 1 tablet (500 mg total) by mouth 2 (two) times daily with a meal. 10/03/19   Noemi Chapel, MD    Allergies    Patient has no known allergies.  Review of Systems   Review of Systems  Musculoskeletal: Positive for arthralgias.  Neurological: Negative for weakness and numbness.      Physical Exam Updated Vital Signs BP (!) 152/92 (BP Location: Left Arm)   Pulse (!) 101   Temp 98.3 F (36.8 C) (Oral)   Resp 12   Ht 1.702 m (5\' 7" )   Wt 81.6 kg   SpO2 96%   BMI 28.19 kg/m   Physical Exam Vitals and nursing note reviewed.  Constitutional:      General: He is not in acute distress.    Appearance: He is well-developed. He is not diaphoretic.  HENT:     Head: Normocephalic and atraumatic.  Eyes:     General: No scleral icterus.    Conjunctiva/sclera: Conjunctivae normal.  Cardiovascular:     Rate and Rhythm: Normal rate and regular rhythm.  Pulmonary:     Effort: Pulmonary effort is normal.     Breath sounds: Normal breath sounds.  Musculoskeletal:        General: Tenderness present.     Comments: The patient is able to straight leg raise bilaterally against resistance.  The entire extensor mechanism is intact bilaterally.  I viewed his skin, he had a lidocaine patch on the left knee, there is no underlying redness or open wounds.  There is no tenderness over the patella,  Skin:    General: Skin is warm  and dry.     Findings: No rash.  Neurological:     Mental Status: He is alert.     Comments: Normal strength and sensation, there is pain with ambulation but I have watched him ambulate with a slight antalgic gait     ED Results / Procedures / Treatments   Labs (all labs ordered are listed, but only abnormal results are displayed) Labs Reviewed - No data to display  EKG None  Radiology DG Knee Complete 4 Views Left  Result Date: 10/03/2019 CLINICAL DATA:  Fall, severe knee pain around left patella EXAM: LEFT KNEE - COMPLETE 4+ VIEW COMPARISON:  None. FINDINGS: Mild-to-moderate tricompartmental osteoarthritic changes worse in medial and patellofemoral compartments. No signs of acute fracture or joint effusion. IMPRESSION: Mild-to-moderate tricompartmental osteoarthritic changes worse in medial and patellofemoral compartments. Electronically  Signed   By: Donzetta Kohut M.D.   On: 10/03/2019 12:52    Procedures Procedures (including critical care time)  Medications Ordered in ED Medications  naproxen (NAPROSYN) tablet 500 mg (has no administration in time range)    ED Course  I have reviewed the triage vital signs and the nursing notes.  Pertinent labs & imaging results that were available during my care of the patient were reviewed by me and considered in my medical decision making (see chart for details).    MDM Rules/Calculators/A&P                      The patient has a bruised knee, there does not appear to be any signs of fracture.  I have personally viewed and interpreted the x-rays and sees no signs of significant effusion or injury to the bony structures of the knee.  He will be given an anti-inflammatory and crutches, he is agreeable to the plan, he wants referral to a orthopedic surgeon because of chronic right knee pain and the need for possible joint replacement.  The patient's left knee hurts more than the right knee but they both appear normal on exam, we can defer images of the right knee as there is less pain on that side  Final Clinical Impression(s) / ED Diagnoses Final diagnoses:  Contusion of knee, unspecified laterality, initial encounter    Rx / DC Orders ED Discharge Orders         Ordered    naproxen (NAPROSYN) 500 MG tablet  2 times daily with meals     10/03/19 1301           Eber Hong, MD 10/03/19 1302
